# Patient Record
Sex: Female | Born: 1935 | Race: White | Hispanic: No | Marital: Married
Health system: Southern US, Community
[De-identification: ages and names within clinical notes are randomized; demographics above are authoritative.]

---

## 2004-01-20 ENCOUNTER — Other Ambulatory Visit: Payer: Self-pay

## 2004-01-22 ENCOUNTER — Other Ambulatory Visit: Payer: Self-pay

## 2004-01-23 ENCOUNTER — Other Ambulatory Visit: Payer: Self-pay

## 2004-06-20 ENCOUNTER — Encounter: Payer: Self-pay | Admitting: Internal Medicine

## 2004-08-24 ENCOUNTER — Ambulatory Visit: Payer: Self-pay | Admitting: Internal Medicine

## 2004-09-09 ENCOUNTER — Ambulatory Visit: Payer: Self-pay | Admitting: Internal Medicine

## 2004-10-07 ENCOUNTER — Ambulatory Visit: Payer: Self-pay | Admitting: Internal Medicine

## 2004-11-09 ENCOUNTER — Ambulatory Visit: Payer: Self-pay | Admitting: Internal Medicine

## 2004-11-23 ENCOUNTER — Ambulatory Visit: Payer: Self-pay | Admitting: Internal Medicine

## 2004-11-23 ENCOUNTER — Ambulatory Visit: Payer: Self-pay | Admitting: Family Medicine

## 2004-12-21 ENCOUNTER — Ambulatory Visit: Payer: Self-pay | Admitting: Internal Medicine

## 2005-01-07 ENCOUNTER — Ambulatory Visit (HOSPITAL_COMMUNITY): Admission: RE | Admit: 2005-01-07 | Discharge: 2005-01-07 | Payer: Self-pay | Admitting: Neurosurgery

## 2005-01-14 ENCOUNTER — Encounter: Payer: Self-pay | Admitting: Internal Medicine

## 2005-01-18 ENCOUNTER — Ambulatory Visit: Payer: Self-pay | Admitting: Internal Medicine

## 2005-01-26 ENCOUNTER — Ambulatory Visit: Payer: Self-pay | Admitting: Internal Medicine

## 2005-01-28 ENCOUNTER — Ambulatory Visit: Payer: Self-pay | Admitting: General Surgery

## 2005-01-31 ENCOUNTER — Ambulatory Visit: Payer: Self-pay | Admitting: Internal Medicine

## 2005-02-11 ENCOUNTER — Ambulatory Visit: Payer: Self-pay | Admitting: Internal Medicine

## 2005-03-14 ENCOUNTER — Ambulatory Visit: Payer: Self-pay | Admitting: Internal Medicine

## 2005-03-28 ENCOUNTER — Inpatient Hospital Stay (HOSPITAL_COMMUNITY): Admission: RE | Admit: 2005-03-28 | Discharge: 2005-03-30 | Payer: Self-pay | Admitting: Neurosurgery

## 2005-04-06 ENCOUNTER — Ambulatory Visit: Payer: Self-pay | Admitting: Internal Medicine

## 2005-04-18 ENCOUNTER — Ambulatory Visit: Payer: Self-pay | Admitting: Internal Medicine

## 2005-05-02 ENCOUNTER — Ambulatory Visit: Payer: Self-pay | Admitting: Internal Medicine

## 2005-05-31 ENCOUNTER — Ambulatory Visit: Payer: Self-pay | Admitting: Internal Medicine

## 2005-06-14 ENCOUNTER — Ambulatory Visit: Payer: Self-pay | Admitting: General Surgery

## 2005-06-28 ENCOUNTER — Ambulatory Visit: Payer: Self-pay | Admitting: Internal Medicine

## 2005-07-12 ENCOUNTER — Ambulatory Visit: Payer: Self-pay | Admitting: Internal Medicine

## 2005-08-02 ENCOUNTER — Ambulatory Visit: Payer: Self-pay | Admitting: Internal Medicine

## 2005-08-03 ENCOUNTER — Ambulatory Visit: Payer: Self-pay | Admitting: Ophthalmology

## 2005-08-10 ENCOUNTER — Ambulatory Visit: Payer: Self-pay | Admitting: Ophthalmology

## 2005-08-30 ENCOUNTER — Ambulatory Visit: Payer: Self-pay | Admitting: Internal Medicine

## 2005-09-13 ENCOUNTER — Ambulatory Visit: Payer: Self-pay | Admitting: Internal Medicine

## 2005-09-14 ENCOUNTER — Ambulatory Visit: Payer: Self-pay | Admitting: Internal Medicine

## 2005-09-15 ENCOUNTER — Ambulatory Visit: Payer: Self-pay | Admitting: Internal Medicine

## 2005-09-16 ENCOUNTER — Ambulatory Visit: Payer: Self-pay | Admitting: Internal Medicine

## 2005-09-21 ENCOUNTER — Ambulatory Visit: Payer: Self-pay | Admitting: Internal Medicine

## 2005-09-27 ENCOUNTER — Ambulatory Visit: Payer: Self-pay | Admitting: Internal Medicine

## 2005-10-25 ENCOUNTER — Ambulatory Visit: Payer: Self-pay | Admitting: Internal Medicine

## 2005-11-09 ENCOUNTER — Ambulatory Visit: Payer: Self-pay | Admitting: Internal Medicine

## 2005-11-22 ENCOUNTER — Ambulatory Visit: Payer: Self-pay | Admitting: Internal Medicine

## 2005-12-01 ENCOUNTER — Ambulatory Visit: Payer: Self-pay | Admitting: General Surgery

## 2005-12-07 ENCOUNTER — Ambulatory Visit: Payer: Self-pay | Admitting: Internal Medicine

## 2006-01-05 ENCOUNTER — Ambulatory Visit: Payer: Self-pay | Admitting: Internal Medicine

## 2006-02-01 ENCOUNTER — Ambulatory Visit: Payer: Self-pay | Admitting: Internal Medicine

## 2006-02-16 ENCOUNTER — Ambulatory Visit: Payer: Self-pay | Admitting: Internal Medicine

## 2006-02-22 ENCOUNTER — Ambulatory Visit: Payer: Self-pay | Admitting: Internal Medicine

## 2006-03-16 ENCOUNTER — Ambulatory Visit: Payer: Self-pay | Admitting: Internal Medicine

## 2006-03-23 ENCOUNTER — Ambulatory Visit: Payer: Self-pay | Admitting: Internal Medicine

## 2006-04-06 ENCOUNTER — Ambulatory Visit: Payer: Self-pay | Admitting: Internal Medicine

## 2006-05-09 ENCOUNTER — Ambulatory Visit: Payer: Self-pay | Admitting: Internal Medicine

## 2006-06-08 ENCOUNTER — Ambulatory Visit: Payer: Self-pay | Admitting: General Surgery

## 2006-12-07 ENCOUNTER — Ambulatory Visit: Payer: Self-pay | Admitting: General Surgery

## 2007-02-05 ENCOUNTER — Other Ambulatory Visit: Payer: Self-pay

## 2007-02-05 ENCOUNTER — Emergency Department: Payer: Self-pay | Admitting: Unknown Physician Specialty

## 2007-06-11 ENCOUNTER — Ambulatory Visit: Payer: Self-pay | Admitting: General Surgery

## 2007-08-27 ENCOUNTER — Ambulatory Visit: Payer: Self-pay | Admitting: Family Medicine

## 2007-12-06 ENCOUNTER — Ambulatory Visit: Payer: Self-pay | Admitting: Family Medicine

## 2007-12-07 ENCOUNTER — Ambulatory Visit: Payer: Self-pay | Admitting: General Surgery

## 2008-01-09 ENCOUNTER — Ambulatory Visit: Payer: Self-pay

## 2008-01-10 ENCOUNTER — Ambulatory Visit: Payer: Self-pay | Admitting: Family Medicine

## 2008-01-10 ENCOUNTER — Encounter: Payer: Self-pay | Admitting: Internal Medicine

## 2008-03-04 ENCOUNTER — Ambulatory Visit: Payer: Self-pay | Admitting: Family Medicine

## 2008-03-05 ENCOUNTER — Ambulatory Visit (HOSPITAL_COMMUNITY): Admission: RE | Admit: 2008-03-05 | Discharge: 2008-03-05 | Payer: Self-pay | Admitting: Family Medicine

## 2008-06-03 ENCOUNTER — Other Ambulatory Visit: Payer: Self-pay

## 2008-06-03 ENCOUNTER — Emergency Department: Payer: Self-pay | Admitting: Emergency Medicine

## 2008-09-17 ENCOUNTER — Ambulatory Visit: Payer: Self-pay | Admitting: Family Medicine

## 2008-10-07 ENCOUNTER — Telehealth: Payer: Self-pay | Admitting: Internal Medicine

## 2008-10-14 ENCOUNTER — Ambulatory Visit: Payer: Self-pay | Admitting: Internal Medicine

## 2008-10-14 DIAGNOSIS — I4891 Unspecified atrial fibrillation: Secondary | ICD-10-CM | POA: Insufficient documentation

## 2008-10-14 DIAGNOSIS — I2510 Atherosclerotic heart disease of native coronary artery without angina pectoris: Secondary | ICD-10-CM | POA: Insufficient documentation

## 2008-10-14 DIAGNOSIS — R930 Abnormal findings on diagnostic imaging of skull and head, not elsewhere classified: Secondary | ICD-10-CM | POA: Insufficient documentation

## 2008-10-14 DIAGNOSIS — J4489 Other specified chronic obstructive pulmonary disease: Secondary | ICD-10-CM | POA: Insufficient documentation

## 2008-10-14 DIAGNOSIS — J449 Chronic obstructive pulmonary disease, unspecified: Secondary | ICD-10-CM

## 2008-10-14 DIAGNOSIS — I219 Acute myocardial infarction, unspecified: Secondary | ICD-10-CM | POA: Insufficient documentation

## 2008-10-14 DIAGNOSIS — I10 Essential (primary) hypertension: Secondary | ICD-10-CM | POA: Insufficient documentation

## 2008-11-07 ENCOUNTER — Ambulatory Visit: Payer: Self-pay | Admitting: Internal Medicine

## 2008-11-17 ENCOUNTER — Encounter: Payer: Self-pay | Admitting: Internal Medicine

## 2008-11-18 ENCOUNTER — Encounter: Payer: Self-pay | Admitting: Internal Medicine

## 2008-11-21 ENCOUNTER — Encounter: Payer: Self-pay | Admitting: Internal Medicine

## 2008-11-26 ENCOUNTER — Encounter: Payer: Self-pay | Admitting: Internal Medicine

## 2008-12-03 ENCOUNTER — Ambulatory Visit: Payer: Self-pay | Admitting: Internal Medicine

## 2008-12-08 ENCOUNTER — Ambulatory Visit: Payer: Self-pay | Admitting: Internal Medicine

## 2008-12-09 ENCOUNTER — Inpatient Hospital Stay: Payer: Self-pay | Admitting: Internal Medicine

## 2008-12-11 ENCOUNTER — Telehealth (INDEPENDENT_AMBULATORY_CARE_PROVIDER_SITE_OTHER): Payer: Self-pay | Admitting: *Deleted

## 2008-12-12 ENCOUNTER — Ambulatory Visit: Payer: Self-pay | Admitting: Internal Medicine

## 2008-12-17 ENCOUNTER — Ambulatory Visit: Payer: Self-pay

## 2008-12-18 ENCOUNTER — Ambulatory Visit: Payer: Self-pay | Admitting: Family Medicine

## 2008-12-19 ENCOUNTER — Ambulatory Visit: Payer: Self-pay | Admitting: Family Medicine

## 2008-12-23 ENCOUNTER — Ambulatory Visit: Payer: Self-pay

## 2008-12-25 ENCOUNTER — Inpatient Hospital Stay: Payer: Self-pay | Admitting: Internal Medicine

## 2009-01-02 ENCOUNTER — Encounter: Payer: Self-pay | Admitting: Internal Medicine

## 2009-01-05 ENCOUNTER — Ambulatory Visit: Payer: Self-pay | Admitting: Internal Medicine

## 2009-01-05 ENCOUNTER — Telehealth: Payer: Self-pay | Admitting: Internal Medicine

## 2009-01-05 ENCOUNTER — Encounter: Payer: Self-pay | Admitting: Internal Medicine

## 2009-01-13 ENCOUNTER — Ambulatory Visit: Payer: Self-pay | Admitting: Internal Medicine

## 2009-01-21 ENCOUNTER — Ambulatory Visit: Payer: Self-pay | Admitting: Internal Medicine

## 2009-02-05 ENCOUNTER — Encounter: Payer: Self-pay | Admitting: Internal Medicine

## 2009-05-13 ENCOUNTER — Inpatient Hospital Stay: Payer: Self-pay | Admitting: Internal Medicine

## 2009-06-03 ENCOUNTER — Ambulatory Visit: Payer: Self-pay | Admitting: Family Medicine

## 2009-06-07 ENCOUNTER — Ambulatory Visit: Payer: Self-pay | Admitting: Internal Medicine

## 2009-06-12 ENCOUNTER — Ambulatory Visit: Payer: Self-pay | Admitting: Family Medicine

## 2009-06-19 ENCOUNTER — Other Ambulatory Visit: Payer: Self-pay | Admitting: Family Medicine

## 2009-06-22 ENCOUNTER — Other Ambulatory Visit: Payer: Self-pay | Admitting: Family Medicine

## 2009-06-23 ENCOUNTER — Ambulatory Visit: Payer: Self-pay | Admitting: Internal Medicine

## 2009-06-27 ENCOUNTER — Inpatient Hospital Stay: Payer: Self-pay | Admitting: Internal Medicine

## 2009-07-02 ENCOUNTER — Emergency Department: Payer: Self-pay | Admitting: Emergency Medicine

## 2009-07-07 ENCOUNTER — Ambulatory Visit: Payer: Self-pay | Admitting: Urology

## 2009-07-08 ENCOUNTER — Ambulatory Visit: Payer: Self-pay | Admitting: Internal Medicine

## 2009-08-07 ENCOUNTER — Ambulatory Visit: Payer: Self-pay | Admitting: Internal Medicine

## 2009-09-07 ENCOUNTER — Ambulatory Visit: Payer: Self-pay | Admitting: Internal Medicine

## 2009-10-10 ENCOUNTER — Ambulatory Visit: Payer: Self-pay | Admitting: Family Medicine

## 2009-10-23 ENCOUNTER — Ambulatory Visit: Payer: Self-pay | Admitting: Family Medicine

## 2009-10-28 ENCOUNTER — Ambulatory Visit: Payer: Self-pay | Admitting: Family Medicine

## 2009-11-18 ENCOUNTER — Ambulatory Visit: Payer: Self-pay | Admitting: Family Medicine

## 2009-11-20 ENCOUNTER — Ambulatory Visit: Payer: Self-pay | Admitting: Family Medicine

## 2009-11-23 ENCOUNTER — Ambulatory Visit: Payer: Self-pay | Admitting: Family Medicine

## 2009-11-24 ENCOUNTER — Observation Stay: Payer: Self-pay | Admitting: Internal Medicine

## 2009-11-30 ENCOUNTER — Ambulatory Visit: Payer: Self-pay | Admitting: Family Medicine

## 2009-12-04 ENCOUNTER — Inpatient Hospital Stay: Payer: Self-pay | Admitting: Orthopedic Surgery

## 2009-12-08 ENCOUNTER — Encounter: Payer: Self-pay | Admitting: Internal Medicine

## 2010-01-05 ENCOUNTER — Encounter: Payer: Self-pay | Admitting: Internal Medicine

## 2010-03-16 ENCOUNTER — Ambulatory Visit: Payer: Self-pay | Admitting: Urology

## 2010-05-26 IMAGING — CR DG CHEST 1V PORT
1 series · 1 of 1 positions shown · non-contrast
Comparison: none

REASON FOR EXAM: dyspnea  --  ed [HOSPITAL]
COMMENTS:

[view not recorded]
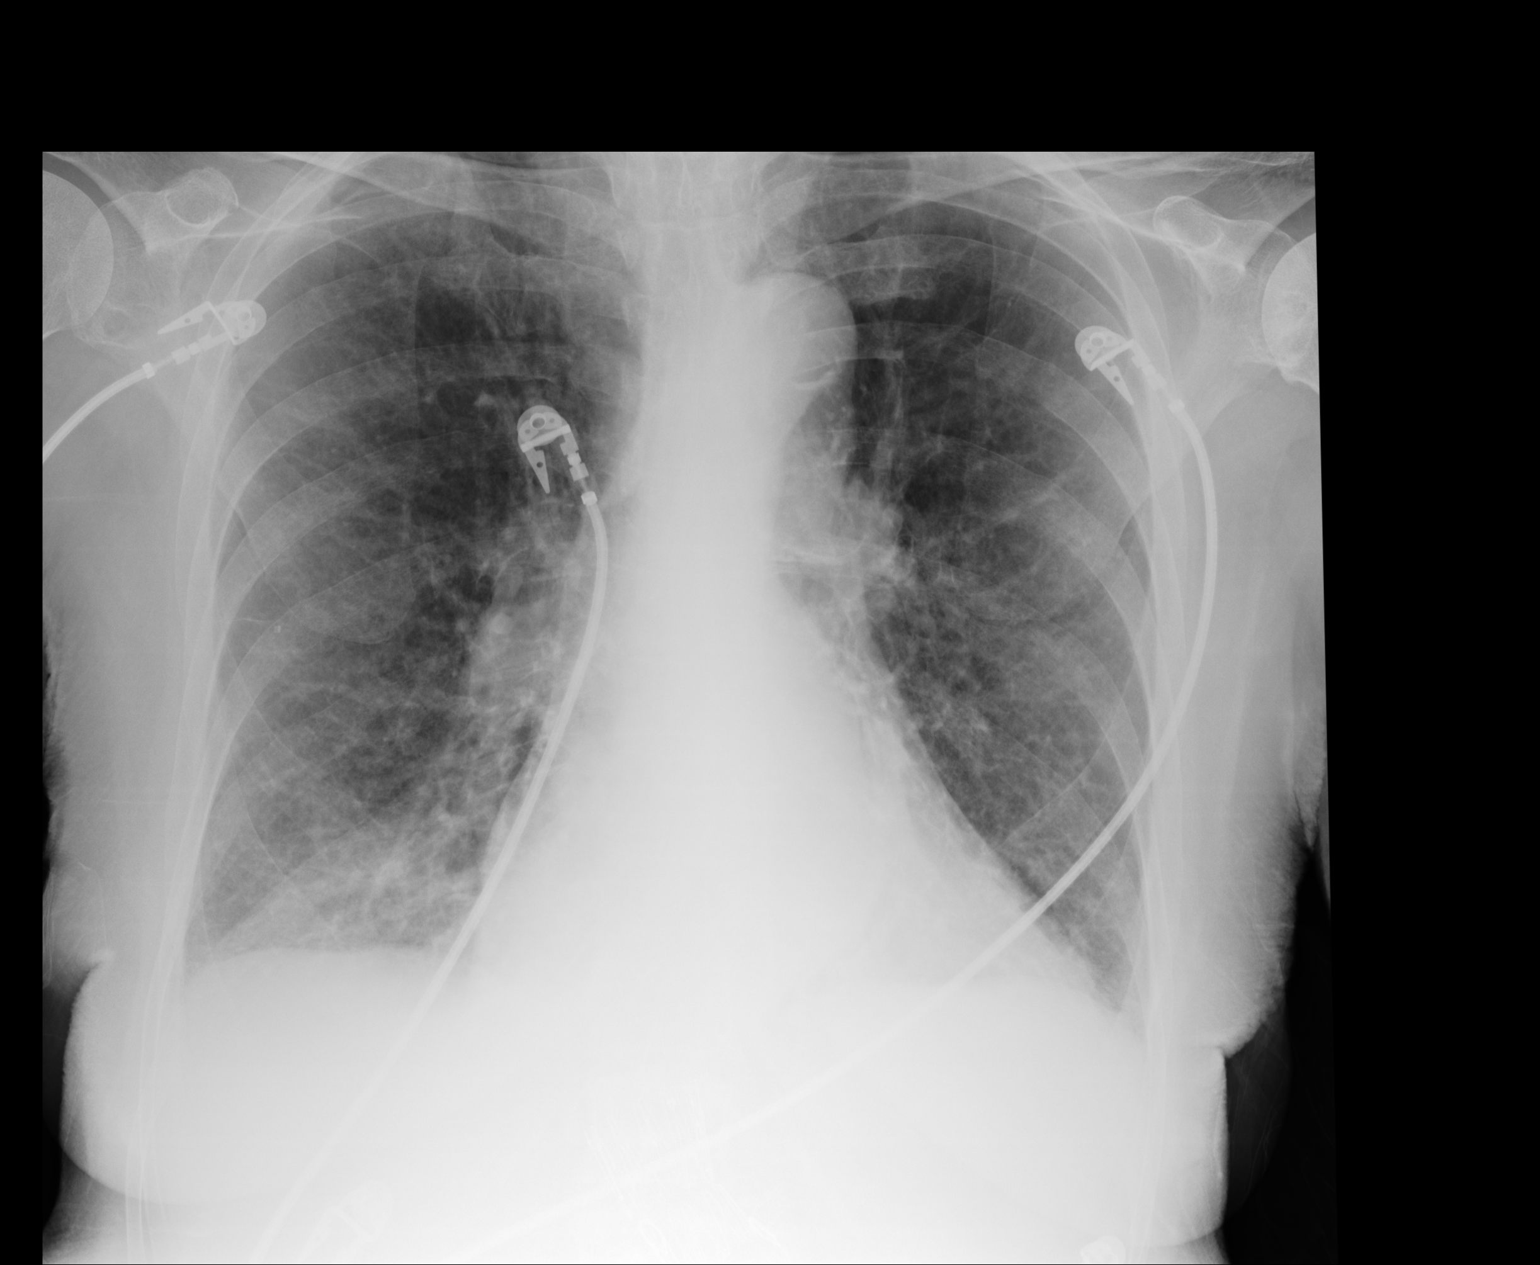

[1 of 1 positions shown; findings below may reference images not displayed]

PROCEDURE:     DXR - DXR PORTABLE CHEST SINGLE VIEW  - December 09, 2008  [DATE]

RESULT:     Comparison is made to prior study dated 09/17/08.

There is flattening of the hemidiaphragms. Thickening of the interstitial
marking is appreciated. No focal regions of consolidation or focal
infiltrate are appreciated. The cardiac silhouette is moderately enlarged.
The visualized bony skeleton is osteopenic.
IMPRESSION: 1. COPD with superimposed pulmonary fibrosis as well as possibly pulmonary
vascular congestion. No focal regions of consolidation.

## 2010-12-22 IMAGING — US US RENAL KIDNEY
1 series · 17 of 25 positions shown · non-contrast
Comparison: none

REASON FOR EXAM: nephrolithiasis
COMMENTS:

[Series 1: us renal kidney · 17 of 42 slices shown]
[im 1/42]
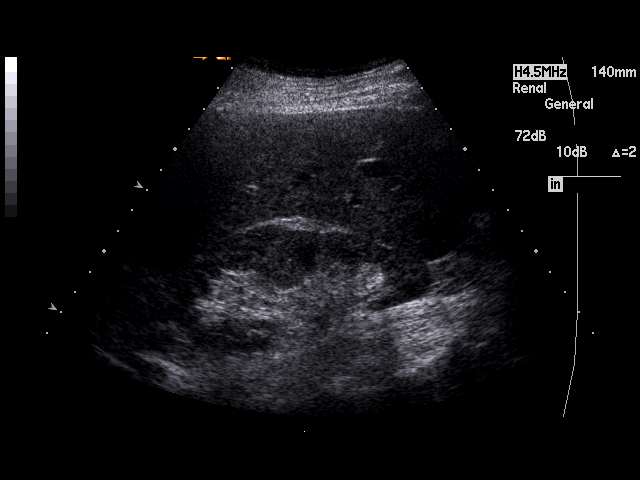
[im 4/42]
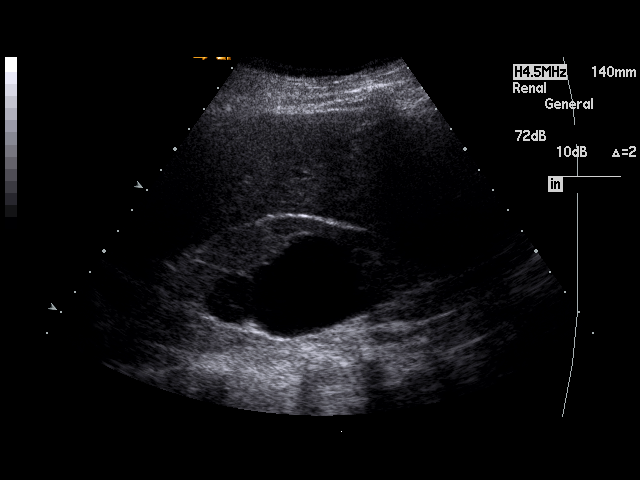
[im 6/42]
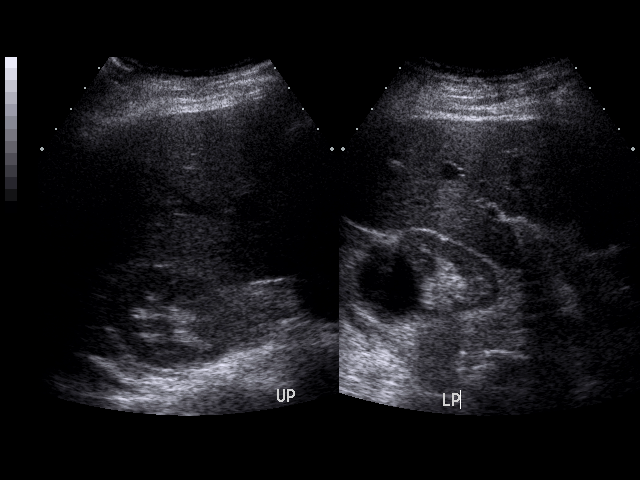
[im 9/42]
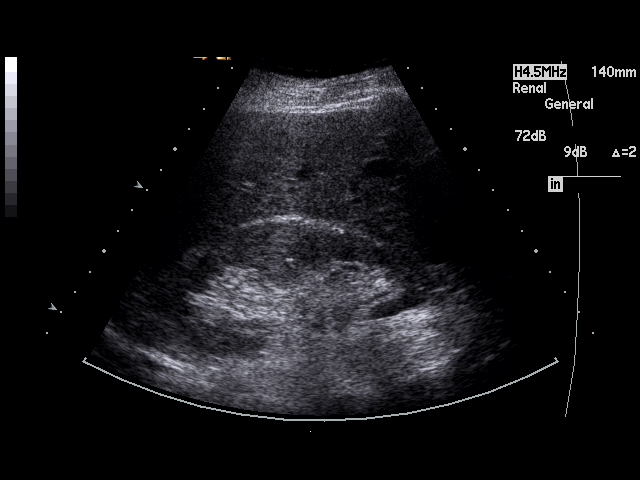
[im 11/42]
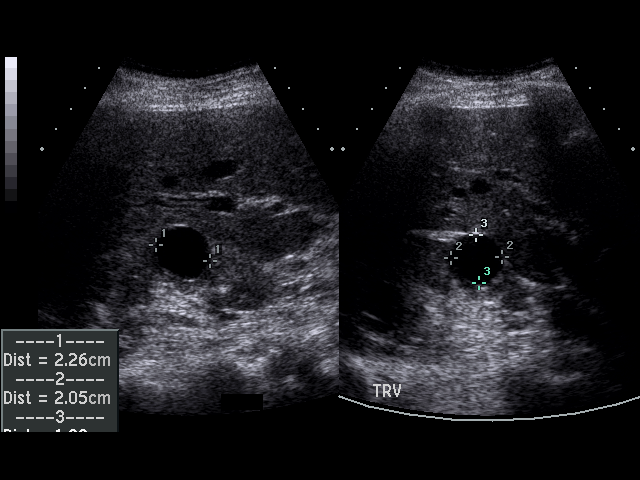
[im 14/42]
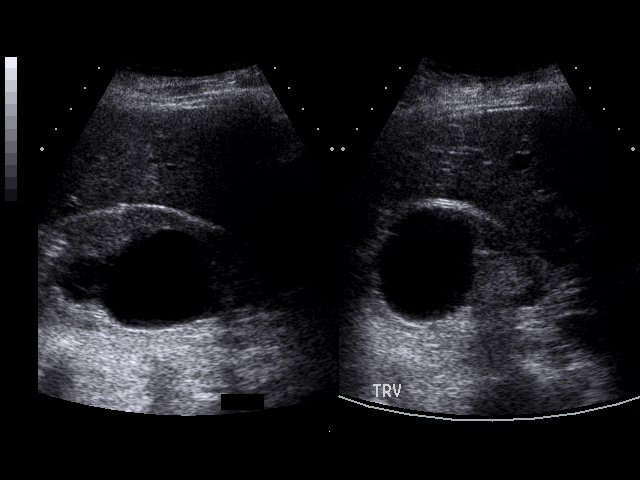
[im 16/42]
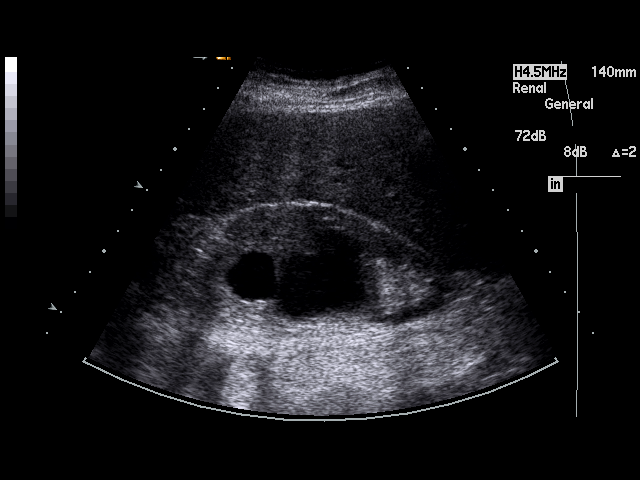
[im 19/42]
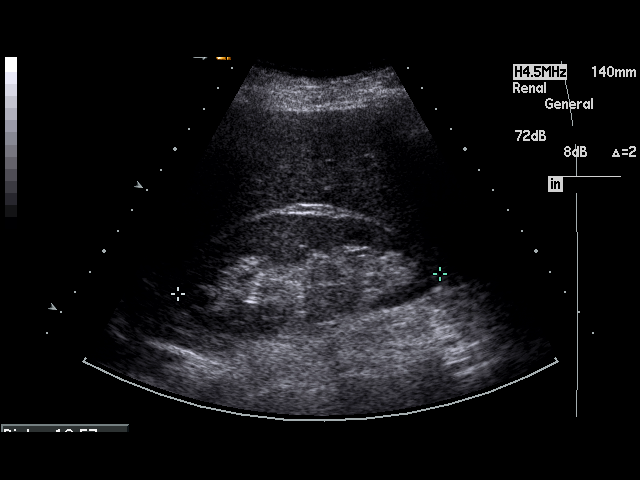
[im 21/42]
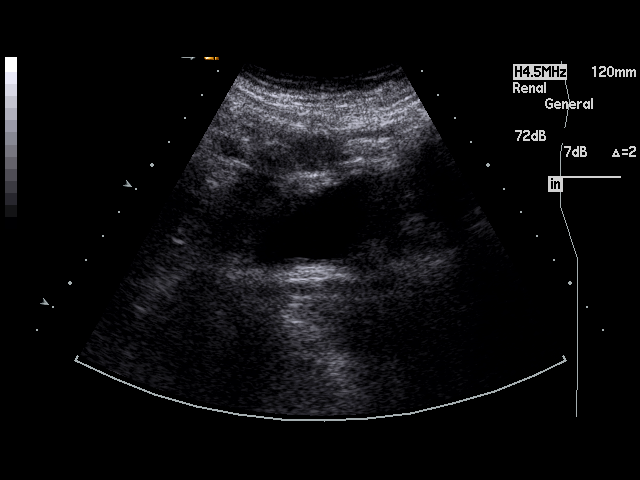
[im 23/42]
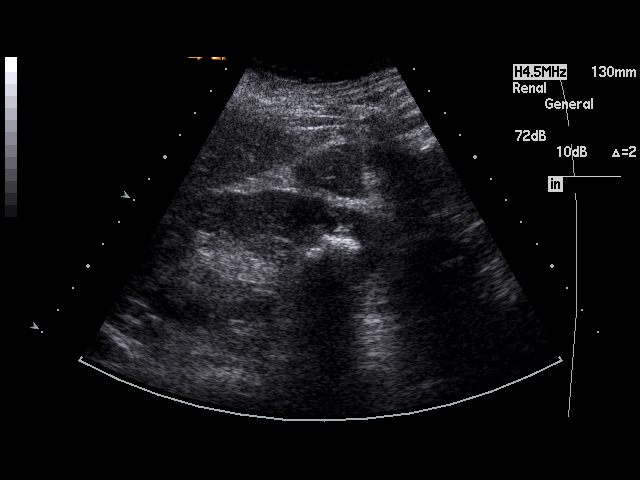
[im 26/42]
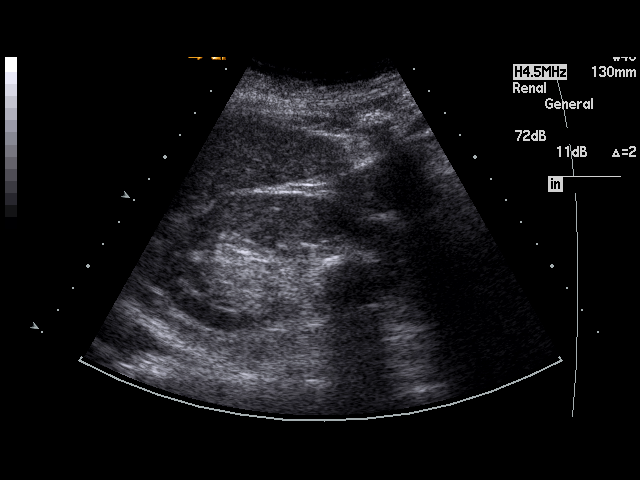
[im 28/42]
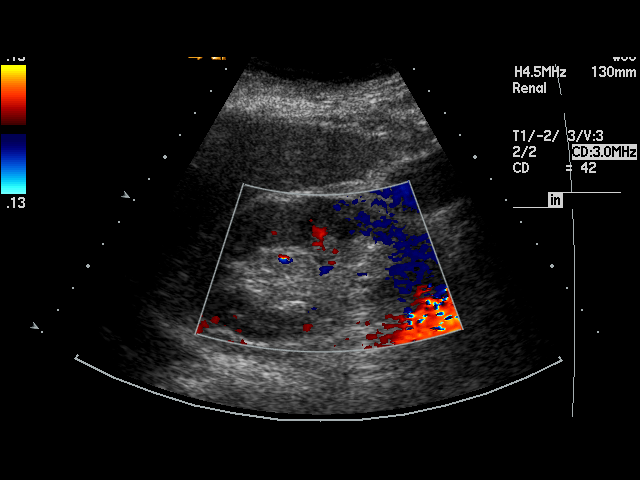
[im 31/42]
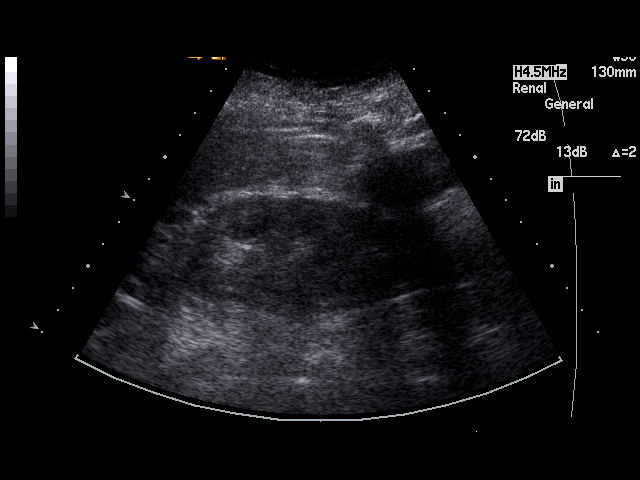
[im 33/42]
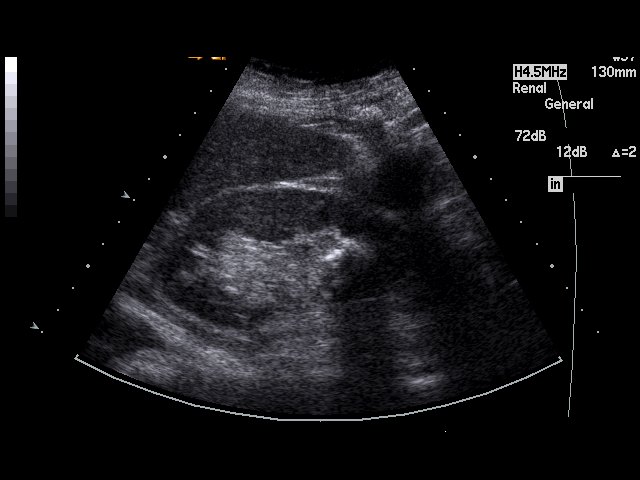
[im 36/42]
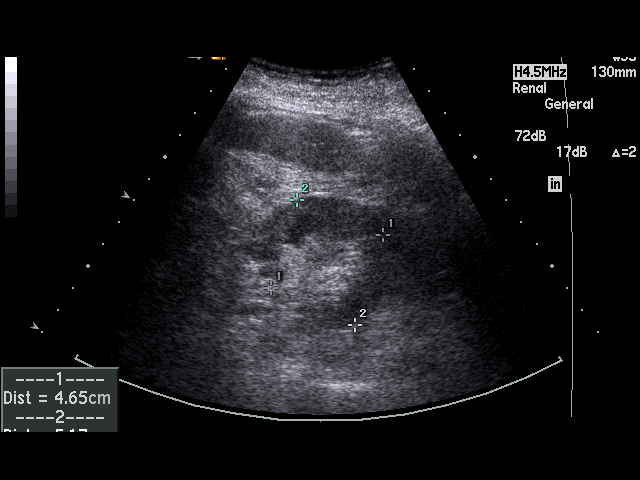
[im 38/42]
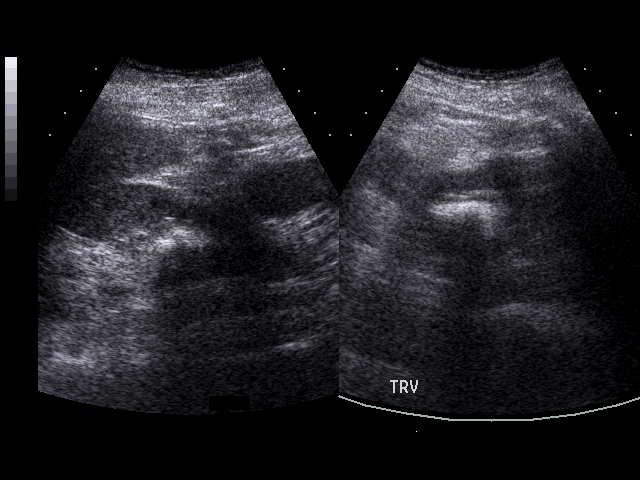
[im 42/42]
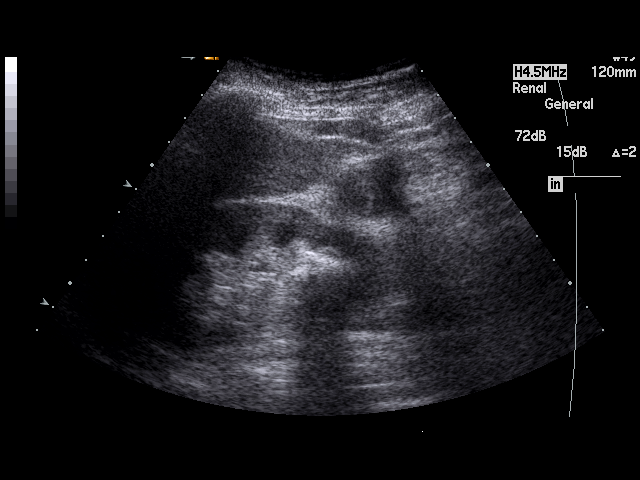

[17 of 25 positions shown; findings below may reference images not displayed]

PROCEDURE:     US  - US KIDNEY  - July 07, 2009  [DATE]

RESULT:     The right kidney measures 10.52 cm x 4.96 cm x 4.8 cm and the
left kidney measures 10.15 cm x 4.65 cm x 5.17 cm. There are observed three
cysts of the right kidney. The largest is in the midpole region and measures
4.45 cm at maximum diameter. Two additional cysts are seen on the right with
the larger measuring 2.5 cm at maximum diameter and a smaller measuring
cm at maximum diameter. No solid or cystic renal masses are seen on the
left. No hydronephrosis is seen. There are echodensities at the lower pole
of the left kidney consistent with renal calcifications. The wall of the
urinary bladder appears slightly thickened but this may be due to the nearly
empty status of the bladder. No ascites is observed.
IMPRESSION: 1. There are multiple right renal cysts.
2. Left nephrolithiasis.
3. There is mild nonspecific thickening of the bladder wall.
4. No hydronephrosis is seen.

## 2011-03-25 NOTE — Op Note (Signed)
Morgan Levine, Morgan Levine              ACCOUNT NO.:  [id number]   MEDICAL RECORD NO.:  [id number]          PATIENT TYPE:  INP   LOCATION:  2899                         FACILITY:  [facility]   PHYSICIAN:  Clydene Fake, M.D.  DATE OF BIRTH:  October 03, 1933   DATE OF PROCEDURE:  03/28/2005  DATE OF DISCHARGE:                                 OPERATIVE REPORT   DIAGNOSES:  1.  Lumbar stenosis, spondylosis L3-4 and 5-1 with prior surgery.  2.  Disk herniation, right sided, at 5-1.   POSTOPERATIVE DIAGNOSES:  1.  Lumbar stenosis, spondylosis L3-4 and 5-1 with prior surgery.  2.  Disk herniation right side at 5-1.   PROCEDURE:  1.  Redo decompressive laminectomy L5 S1 with right sided diskectomy, and      right L3-4 decompressive laminectomy.  2.  Cavo microdissection with microscope.   SURGEON:  Colon Branch, M.D.   ASSISTANT:  Maeola Harman, M.D.   ANESTHESIA:  General endotracheal tube.   ESTIMATED BLOOD LOSS:  Minimal.   BLOOD GIVEN:  None.   DRAINS:  None.   COMPLICATIONS:  None.   REASON FOR PROCEDURE:  The patient is a 77 year old woman who has had back  and right leg pain and numbness that radiates down into her foot and MRI was  done showing lumbar stenosis spondylitic changes 3-4, 4-5, 5-1 but more  stenosis at 3-4 and 5-1 with large disk herniation right side at 5-1.  Patient brought in for decompressive laminectomy.  She has some cardiac  history, been on Coumadin, she had been off that for days and the labs preop  were good and she has been through cardiac and vascular surgery clearance  for the medical problems.   PROCEDURE IN DETAIL:  The patient was brought to the operating room, general  anesthesia induced.  The patient was placed in the prone position on a  Wilson frame with all pressure points padded.  The patient was prepped and  draped in a sterile fashion in the supine position, was injected with 20 mL  of 1% lidocaine with epinephrine.  Incision has been made at  the site of  previous scar in the lumbar spine incision and this was extended both  cephalad and caudally and incision taken down to the fascia.  Hemostasis was  obtained with Bovie cauterization.  The fascia was incised with the Bovie  and markers were placed at the two interspaces.  X-rays were obtained  assuring this was the 2-3 and 4-5 disk spaces.  We then dissected the 3-4  and 5-1 spaces, doing a subperiosteal dissection of the spinous process of  lamina up the facet, so retractors were placed at both levels.  Markers were  placed at the interspaces.  Another x-ray obtained confirmed their  positioning at 3-4 and 5-1 spaces.  A high speed drill was then used to  start a semihemilaminectomy.  After a scope was brought in for  microdissection, this was at the 3-4 level, and Kerrison punches were used  to continue the decompressive laminectomy and medial facetectomy.  The  ligamentum flavum was removed.  The top of the fourth lamina was done over  the four roots.  We used upward angled curets to remove hypertrophic  ligaments contralaterally, esophacardially and laterally making sure we  had  good decompression of this level.  We explored the epidural space and disk  space, there was a disk bulge but no herniation, disk was left intact.  Hemostasis was obtained with Gelfoam and  thrombin.  Attention was then  taken to the 5-1 level.  There was some hemilaminectomy that was made with  an instrument drill and then Kerrison punches.  There was some scar on this  area, consistent with prior surgery.  We carefully dissected through the  scar, removed very hypertrophic ligaments and dissected down to the disk  space where free fragment disk was found, this was removed, this  decompressed the S1 root.  We then entered the disk space and performed  diskectomy with pituitary rongeurs and curets.  When we were finished, we  had good central and lateral decompression of the central canal at 5-1  along  with decompression of the S1 and L5 roots.  __________ irrigation was used.  Hemostasis obtained with bipolar cauterization and Gelfoam and thrombin.  Gelfoam was irrigated out and we removed all retractors and irrigated with  antibiotic solution.  The fascia was closed with 0 Vicryl interrupted type  suture, the subcutaneous tissue closed with 2-0 and 3-0 Vicryl interrupted  suture and the skin closed with Benzoin and Steri-Strips.  A dressing was  placed, the patient was placed back into a supine, awoken from anesthesia  and transferred to the recovery room in stable condition.      JRH/MEDQ  D:  03/28/2005  T:  03/28/2005  Job:  [id number]

## 2011-04-22 ENCOUNTER — Emergency Department: Payer: Self-pay | Admitting: Emergency Medicine

## 2011-04-23 ENCOUNTER — Inpatient Hospital Stay: Payer: Self-pay | Admitting: Internal Medicine

## 2011-05-31 ENCOUNTER — Ambulatory Visit: Payer: Self-pay | Admitting: Urology

## 2011-08-02 LAB — CROSSMATCH

## 2011-12-15 ENCOUNTER — Ambulatory Visit: Payer: Self-pay | Admitting: Family Medicine

## 2013-01-01 ENCOUNTER — Ambulatory Visit: Payer: Self-pay | Admitting: Family Medicine

## 2013-01-30 ENCOUNTER — Ambulatory Visit: Payer: Self-pay | Admitting: Family Medicine

## 2014-01-03 ENCOUNTER — Emergency Department: Payer: Self-pay | Admitting: Emergency Medicine

## 2014-01-03 LAB — CBC
HCT: 35 % (ref 35.0–47.0)
HGB: 10.8 g/dL — AB (ref 12.0–16.0)
MCH: 26.7 pg (ref 26.0–34.0)
MCHC: 30.8 g/dL — ABNORMAL LOW (ref 32.0–36.0)
MCV: 87 fL (ref 80–100)
Platelet: 152 10*3/uL (ref 150–440)
RBC: 4.03 10*6/uL (ref 3.80–5.20)
RDW: 17.6 % — ABNORMAL HIGH (ref 11.5–14.5)
WBC: 8.8 10*3/uL (ref 3.6–11.0)

## 2014-01-03 LAB — COMPREHENSIVE METABOLIC PANEL
ALT: 12 U/L (ref 12–78)
ANION GAP: 0 — AB (ref 7–16)
Albumin: 2.6 g/dL — ABNORMAL LOW (ref 3.4–5.0)
Alkaline Phosphatase: 78 U/L
BUN: 19 mg/dL — ABNORMAL HIGH (ref 7–18)
Bilirubin,Total: 0.5 mg/dL (ref 0.2–1.0)
CALCIUM: 9.8 mg/dL (ref 8.5–10.1)
CO2: 34 mmol/L — AB (ref 21–32)
CREATININE: 1.57 mg/dL — AB (ref 0.60–1.30)
Chloride: 106 mmol/L (ref 98–107)
EGFR (Non-African Amer.): 31 — ABNORMAL LOW
GFR CALC AF AMER: 36 — AB
Glucose: 77 mg/dL (ref 65–99)
OSMOLALITY: 280 (ref 275–301)
POTASSIUM: 4.2 mmol/L (ref 3.5–5.1)
SGOT(AST): 16 U/L (ref 15–37)
SODIUM: 140 mmol/L (ref 136–145)
TOTAL PROTEIN: 7.1 g/dL (ref 6.4–8.2)

## 2014-01-03 LAB — PROTIME-INR
INR: 3.5
Prothrombin Time: 34.3 secs — ABNORMAL HIGH (ref 11.5–14.7)

## 2014-01-03 LAB — TROPONIN I: Troponin-I: <0.02 ng/mL

## 2014-02-28 ENCOUNTER — Inpatient Hospital Stay: Payer: Self-pay | Admitting: Internal Medicine

## 2014-02-28 LAB — PROTIME-INR
INR: 3.9
PROTHROMBIN TIME: 36.8 s — AB (ref 11.5–14.7)

## 2014-02-28 LAB — COMPREHENSIVE METABOLIC PANEL
ALK PHOS: 81 U/L
ALT: 10 U/L — AB (ref 12–78)
ANION GAP: 3 — AB (ref 7–16)
AST: 10 U/L — AB (ref 15–37)
Albumin: 2.9 g/dL — ABNORMAL LOW (ref 3.4–5.0)
BUN: 30 mg/dL — AB (ref 7–18)
Bilirubin,Total: 0.6 mg/dL (ref 0.2–1.0)
CHLORIDE: 98 mmol/L (ref 98–107)
CO2: 33 mmol/L — AB (ref 21–32)
Calcium, Total: 10.8 mg/dL — ABNORMAL HIGH (ref 8.5–10.1)
Creatinine: 2.01 mg/dL — ABNORMAL HIGH (ref 0.60–1.30)
GFR CALC AF AMER: 27 — AB
GFR CALC NON AF AMER: 23 — AB
Glucose: 88 mg/dL (ref 65–99)
Osmolality: 274 (ref 275–301)
POTASSIUM: 5.6 mmol/L — AB (ref 3.5–5.1)
SODIUM: 134 mmol/L — AB (ref 136–145)
TOTAL PROTEIN: 7.5 g/dL (ref 6.4–8.2)

## 2014-02-28 LAB — CBC
HCT: 41.1 % (ref 35.0–47.0)
HGB: 12.1 g/dL (ref 12.0–16.0)
MCH: 26.5 pg (ref 26.0–34.0)
MCHC: 29.5 g/dL — ABNORMAL LOW (ref 32.0–36.0)
MCV: 90 fL (ref 80–100)
PLATELETS: 231 10*3/uL (ref 150–440)
RBC: 4.58 10*6/uL (ref 3.80–5.20)
RDW: 18.4 % — ABNORMAL HIGH (ref 11.5–14.5)
WBC: 9.5 10*3/uL (ref 3.6–11.0)

## 2014-02-28 LAB — TROPONIN I

## 2014-02-28 LAB — PRO B NATRIURETIC PEPTIDE: B-Type Natriuretic Peptide: 28472 pg/mL — ABNORMAL HIGH (ref 0–450)

## 2014-02-28 LAB — CK TOTAL AND CKMB (NOT AT ARMC)
CK, TOTAL: 13 U/L — AB
CK-MB: 1.2 ng/mL (ref 0.5–3.6)

## 2014-03-01 LAB — CBC WITH DIFFERENTIAL/PLATELET
Basophil #: 0 10*3/uL (ref 0.0–0.1)
Basophil %: 0.1 %
EOS PCT: 0 %
Eosinophil #: 0 10*3/uL (ref 0.0–0.7)
HCT: 36.2 % (ref 35.0–47.0)
HGB: 11.1 g/dL — ABNORMAL LOW (ref 12.0–16.0)
LYMPHS ABS: 0.2 10*3/uL — AB (ref 1.0–3.6)
Lymphocyte %: 5.6 %
MCH: 27.3 pg (ref 26.0–34.0)
MCHC: 30.8 g/dL — ABNORMAL LOW (ref 32.0–36.0)
MCV: 89 fL (ref 80–100)
MONOS PCT: 1.2 %
Monocyte #: 0 x10 3/mm — ABNORMAL LOW (ref 0.2–0.9)
Neutrophil #: 3.2 10*3/uL (ref 1.4–6.5)
Neutrophil %: 93.1 %
PLATELETS: 179 10*3/uL (ref 150–440)
RBC: 4.07 10*6/uL (ref 3.80–5.20)
RDW: 18.2 % — AB (ref 11.5–14.5)
WBC: 3.5 10*3/uL — ABNORMAL LOW (ref 3.6–11.0)

## 2014-03-01 LAB — BASIC METABOLIC PANEL
Anion Gap: 8 (ref 7–16)
BUN: 36 mg/dL — ABNORMAL HIGH (ref 7–18)
CO2: 26 mmol/L (ref 21–32)
CREATININE: 2.4 mg/dL — AB (ref 0.60–1.30)
Calcium, Total: 10.4 mg/dL — ABNORMAL HIGH (ref 8.5–10.1)
Chloride: 101 mmol/L (ref 98–107)
EGFR (Non-African Amer.): 19 — ABNORMAL LOW
GFR CALC AF AMER: 22 — AB
GLUCOSE: 64 mg/dL — AB (ref 65–99)
OSMOLALITY: 277 (ref 275–301)
Potassium: 6.1 mmol/L — ABNORMAL HIGH (ref 3.5–5.1)
SODIUM: 135 mmol/L — AB (ref 136–145)

## 2014-03-01 LAB — TSH: Thyroid Stimulating Horm: 1.32 u[IU]/mL

## 2014-03-02 LAB — CBC WITH DIFFERENTIAL/PLATELET
BASOS PCT: 0.1 %
Basophil #: 0 10*3/uL (ref 0.0–0.1)
Eosinophil #: 0 10*3/uL (ref 0.0–0.7)
Eosinophil %: 0 %
HCT: 32.2 % — ABNORMAL LOW (ref 35.0–47.0)
HGB: 10 g/dL — AB (ref 12.0–16.0)
LYMPHS PCT: 5.1 %
Lymphocyte #: 0.1 10*3/uL — ABNORMAL LOW (ref 1.0–3.6)
MCH: 27.1 pg (ref 26.0–34.0)
MCHC: 30.9 g/dL — AB (ref 32.0–36.0)
MCV: 88 fL (ref 80–100)
MONO ABS: 0.2 x10 3/mm (ref 0.2–0.9)
Monocyte %: 7.3 %
Neutrophil #: 2.5 10*3/uL (ref 1.4–6.5)
Neutrophil %: 87.5 %
Platelet: 159 10*3/uL (ref 150–440)
RBC: 3.68 10*6/uL — ABNORMAL LOW (ref 3.80–5.20)
RDW: 18.4 % — AB (ref 11.5–14.5)
WBC: 2.9 10*3/uL — ABNORMAL LOW (ref 3.6–11.0)

## 2014-03-02 LAB — BASIC METABOLIC PANEL
ANION GAP: 6 — AB (ref 7–16)
BUN: 44 mg/dL — ABNORMAL HIGH (ref 7–18)
CO2: 30 mmol/L (ref 21–32)
CREATININE: 2.56 mg/dL — AB (ref 0.60–1.30)
Calcium, Total: 9.9 mg/dL (ref 8.5–10.1)
Chloride: 102 mmol/L (ref 98–107)
EGFR (Non-African Amer.): 17 — ABNORMAL LOW
GFR CALC AF AMER: 20 — AB
GLUCOSE: 103 mg/dL — AB (ref 65–99)
OSMOLALITY: 287 (ref 275–301)
Potassium: 4.3 mmol/L (ref 3.5–5.1)
Sodium: 138 mmol/L (ref 136–145)

## 2014-03-02 LAB — PROTIME-INR
INR: 6.4
PROTHROMBIN TIME: 53.9 s — AB (ref 11.5–14.7)

## 2014-03-03 LAB — BASIC METABOLIC PANEL
ANION GAP: 5 — AB (ref 7–16)
BUN: 42 mg/dL — ABNORMAL HIGH (ref 7–18)
CO2: 31 mmol/L (ref 21–32)
Calcium, Total: 9.7 mg/dL (ref 8.5–10.1)
Chloride: 104 mmol/L (ref 98–107)
Creatinine: 2.18 mg/dL — ABNORMAL HIGH (ref 0.60–1.30)
GFR CALC AF AMER: 24 — AB
GFR CALC NON AF AMER: 21 — AB
Glucose: 148 mg/dL — ABNORMAL HIGH (ref 65–99)
OSMOLALITY: 293 (ref 275–301)
Potassium: 3.8 mmol/L (ref 3.5–5.1)
Sodium: 140 mmol/L (ref 136–145)

## 2014-03-03 LAB — PROTIME-INR
INR: 3.7
PROTHROMBIN TIME: 35.3 s — AB (ref 11.5–14.7)

## 2014-03-05 LAB — CULTURE, BLOOD (SINGLE)

## 2014-10-07 ENCOUNTER — Ambulatory Visit: Payer: Self-pay | Admitting: Internal Medicine

## 2014-10-18 ENCOUNTER — Inpatient Hospital Stay: Payer: Self-pay | Admitting: Internal Medicine

## 2014-10-18 LAB — COMPREHENSIVE METABOLIC PANEL
ALK PHOS: 114 U/L
ALT: 8 U/L — AB
AST: 16 U/L (ref 15–37)
Albumin: 3 g/dL — ABNORMAL LOW (ref 3.4–5.0)
Anion Gap: 4 — ABNORMAL LOW (ref 7–16)
BUN: 22 mg/dL — ABNORMAL HIGH (ref 7–18)
Bilirubin,Total: 0.5 mg/dL (ref 0.2–1.0)
CALCIUM: 11.2 mg/dL — AB (ref 8.5–10.1)
CO2: 32 mmol/L (ref 21–32)
CREATININE: 2.18 mg/dL — AB (ref 0.60–1.30)
Chloride: 103 mmol/L (ref 98–107)
GFR CALC AF AMER: 28 — AB
GFR CALC NON AF AMER: 23 — AB
GLUCOSE: 107 mg/dL — AB (ref 65–99)
OSMOLALITY: 281 (ref 275–301)
POTASSIUM: 4.8 mmol/L (ref 3.5–5.1)
SODIUM: 139 mmol/L (ref 136–145)
Total Protein: 8.1 g/dL (ref 6.4–8.2)

## 2014-10-18 LAB — CBC WITH DIFFERENTIAL/PLATELET
Basophil #: 0.1 10*3/uL (ref 0.0–0.1)
Basophil %: 0.5 %
EOS ABS: 0.3 10*3/uL (ref 0.0–0.7)
EOS PCT: 2.8 %
HCT: 39 % (ref 35.0–47.0)
HGB: 11.8 g/dL — ABNORMAL LOW (ref 12.0–16.0)
LYMPHS ABS: 1.1 10*3/uL (ref 1.0–3.6)
Lymphocyte %: 11.1 %
MCH: 27.5 pg (ref 26.0–34.0)
MCHC: 30.2 g/dL — AB (ref 32.0–36.0)
MCV: 91 fL (ref 80–100)
MONOS PCT: 10.5 %
Monocyte #: 1 x10 3/mm — ABNORMAL HIGH (ref 0.2–0.9)
NEUTROS ABS: 7.3 10*3/uL — AB (ref 1.4–6.5)
Neutrophil %: 75.1 %
PLATELETS: 173 10*3/uL (ref 150–440)
RBC: 4.28 10*6/uL (ref 3.80–5.20)
RDW: 17.5 % — AB (ref 11.5–14.5)
WBC: 9.7 10*3/uL (ref 3.6–11.0)

## 2014-10-18 LAB — CK TOTAL AND CKMB (NOT AT ARMC)
CK, TOTAL: 20 U/L — AB (ref 26–192)
CK, Total: 12 U/L — ABNORMAL LOW (ref 26–192)
CK, Total: 14 U/L — ABNORMAL LOW (ref 26–192)
CK-MB: <0.5 ng/mL — ABNORMAL LOW (ref 0.5–3.6)
CK-MB: <0.5 ng/mL — ABNORMAL LOW (ref 0.5–3.6)
CK-MB: 0.6 ng/mL (ref 0.5–3.6)

## 2014-10-18 LAB — URINALYSIS, COMPLETE
BILIRUBIN, UR: NEGATIVE
Glucose,UR: NEGATIVE mg/dL (ref 0–75)
KETONE: NEGATIVE
Nitrite: POSITIVE
PH: 5 (ref 4.5–8.0)
RBC,UR: 395 /HPF (ref 0–5)
SPECIFIC GRAVITY: 1.008 (ref 1.003–1.030)

## 2014-10-18 LAB — PROTIME-INR
INR: 2.1
PROTHROMBIN TIME: 23 s — AB (ref 11.5–14.7)

## 2014-10-18 LAB — TROPONIN I
Troponin-I: <0.02 ng/mL
Troponin-I: <0.02 ng/mL

## 2014-10-18 LAB — LIPASE, BLOOD: LIPASE: 48 U/L — AB (ref 73–393)

## 2014-10-18 LAB — PRO B NATRIURETIC PEPTIDE: B-TYPE NATIURETIC PEPTID: 13972 pg/mL — AB (ref 0–450)

## 2014-10-18 LAB — MAGNESIUM: Magnesium: 1.9 mg/dL

## 2014-10-19 LAB — CBC WITH DIFFERENTIAL/PLATELET
BASOS PCT: 0.1 %
Basophil #: 0 10*3/uL (ref 0.0–0.1)
Eosinophil #: 0 10*3/uL (ref 0.0–0.7)
Eosinophil %: 0.1 %
HCT: 32.1 % — AB (ref 35.0–47.0)
HGB: 10 g/dL — ABNORMAL LOW (ref 12.0–16.0)
LYMPHS PCT: 6 %
Lymphocyte #: 0.2 10*3/uL — ABNORMAL LOW (ref 1.0–3.6)
MCH: 27.9 pg (ref 26.0–34.0)
MCHC: 31.1 g/dL — AB (ref 32.0–36.0)
MCV: 90 fL (ref 80–100)
Monocyte #: 0.1 x10 3/mm — ABNORMAL LOW (ref 0.2–0.9)
Monocyte %: 3.6 %
Neutrophil #: 3 10*3/uL (ref 1.4–6.5)
Neutrophil %: 90.2 %
Platelet: 141 10*3/uL — ABNORMAL LOW (ref 150–440)
RBC: 3.59 10*6/uL — AB (ref 3.80–5.20)
RDW: 16.5 % — ABNORMAL HIGH (ref 11.5–14.5)
WBC: 3.3 10*3/uL — AB (ref 3.6–11.0)

## 2014-10-19 LAB — COMPREHENSIVE METABOLIC PANEL
ALBUMIN: 2.6 g/dL — AB (ref 3.4–5.0)
ALK PHOS: 89 U/L
AST: 12 U/L — AB (ref 15–37)
Anion Gap: 5 — ABNORMAL LOW (ref 7–16)
BILIRUBIN TOTAL: 0.3 mg/dL (ref 0.2–1.0)
BUN: 29 mg/dL — AB (ref 7–18)
CALCIUM: 10.4 mg/dL — AB (ref 8.5–10.1)
CO2: 33 mmol/L — AB (ref 21–32)
Chloride: 101 mmol/L (ref 98–107)
Creatinine: 2.36 mg/dL — ABNORMAL HIGH (ref 0.60–1.30)
GFR CALC AF AMER: 26 — AB
GFR CALC NON AF AMER: 21 — AB
Glucose: 154 mg/dL — ABNORMAL HIGH (ref 65–99)
OSMOLALITY: 286 (ref 275–301)
POTASSIUM: 4.6 mmol/L (ref 3.5–5.1)
SGPT (ALT): 7 U/L — ABNORMAL LOW
SODIUM: 139 mmol/L (ref 136–145)
Total Protein: 7 g/dL (ref 6.4–8.2)

## 2014-10-19 LAB — PROTIME-INR
INR: 2.8
PROTHROMBIN TIME: 29.1 s — AB (ref 11.5–14.7)

## 2014-10-20 LAB — CBC WITH DIFFERENTIAL/PLATELET
BASOS ABS: 0 10*3/uL (ref 0.0–0.1)
Basophil %: 0.1 %
Eosinophil #: 0 10*3/uL (ref 0.0–0.7)
Eosinophil %: 0 %
HCT: 33.4 % — ABNORMAL LOW (ref 35.0–47.0)
HGB: 10.5 g/dL — ABNORMAL LOW (ref 12.0–16.0)
LYMPHS PCT: 3.8 %
Lymphocyte #: 0.2 10*3/uL — ABNORMAL LOW (ref 1.0–3.6)
MCH: 28.1 pg (ref 26.0–34.0)
MCHC: 31.3 g/dL — AB (ref 32.0–36.0)
MCV: 90 fL (ref 80–100)
Monocyte #: 0.3 x10 3/mm (ref 0.2–0.9)
Monocyte %: 5.6 %
NEUTROS PCT: 90.5 %
Neutrophil #: 4.8 10*3/uL (ref 1.4–6.5)
PLATELETS: 151 10*3/uL (ref 150–440)
RBC: 3.73 10*6/uL — ABNORMAL LOW (ref 3.80–5.20)
RDW: 16.5 % — AB (ref 11.5–14.5)
WBC: 5.3 10*3/uL (ref 3.6–11.0)

## 2014-10-20 LAB — BASIC METABOLIC PANEL
ANION GAP: 4 — AB (ref 7–16)
BUN: 35 mg/dL — AB (ref 7–18)
CALCIUM: 10.5 mg/dL — AB (ref 8.5–10.1)
CREATININE: 2.18 mg/dL — AB (ref 0.60–1.30)
Chloride: 98 mmol/L (ref 98–107)
Co2: 36 mmol/L — ABNORMAL HIGH (ref 21–32)
EGFR (African American): 28 — ABNORMAL LOW
GFR CALC NON AF AMER: 23 — AB
Glucose: 108 mg/dL — ABNORMAL HIGH (ref 65–99)
OSMOLALITY: 284 (ref 275–301)
Potassium: 4.1 mmol/L (ref 3.5–5.1)
Sodium: 138 mmol/L (ref 136–145)

## 2014-10-20 LAB — PROTIME-INR
INR: 2.5
Prothrombin Time: 26.2 secs — ABNORMAL HIGH (ref 11.5–14.7)

## 2014-10-20 LAB — URINE CULTURE

## 2014-11-07 ENCOUNTER — Ambulatory Visit: Payer: Self-pay | Admitting: Internal Medicine

## 2015-01-06 DEATH — deceased

## 2015-02-28 NOTE — H&P (Signed)
PATIENT NAME:  Morgan Levine, HALBLEIB MR#:  [id number] DATE OF BIRTH:  Nov 18, 1932  DATE OF ADMISSION:  02/28/2014  PRIMARY CARE PROVIDER: Dr. Margarita Rana.   EMERGENCY DEPARTMENT REFERRING PHYSICIAN: Dr. Kerman Passey.    CHIEF COMPLAINT: Acute respiratory failure, decrease in responsiveness.   HISTORY OF PRESENT ILLNESS: The patient is a 81 year old white female with multiple medical problems including coronary artery disease, chronic respiratory failure on 3 liters of O2, has a history of paroxysmal Afib, COPD, O2 dependent, who is not mobile last days at home with her daughter who is taking care of her, as well as home hospice following her. According to them, the patient has had difficulty with breathing since Sunday and has been drowsy.  They thought that it could have been due to the pain medication that she was receiving, and she has also had decrease in responsiveness. Her breathing was worse since 11:00 today; therefore, she was brought in the ED. Her sats were in the 60s on 3 liters. Due to that, she had to be placed on BiPAP. The patient had an ABG check, which showed hypercarbic respiratory failure. Placed on BiPAP and she is currently still unarousable. Daughter states that she has not noticed her to have any fevers or chills. The patient has been wheezing a lot. Has chronic abdominal pain radiating to her back.   PAST MEDICAL HISTORY: Significant for:  1. Coronary artery disease.  2.  History of inferior wall myocardial infarction with cardiac cath in 2010 that showed a 70% proximal stenosis. 3.  Paroxysmal Afib.  4.  COPD, O2 dependent.  5.  Hypertension and hyperlipidemia.  6.  Chronic renal insufficiency.  7.  History of TIA.  8.  Severe mitral regurg and tricuspid regurg.  9.  Moderate pulmonary hypertension.  10.  History of right hip fracture.  11.  History of thrombocytopenia. Has been followed by hematology in the past. Platelet count currently normal.  12.  History of diastolic  CHF.  13.  Osteoarthritis.  14.  Anxiety and depression.  15.  Macular degeneration.  16.  Peripheral vascular disease.  17.  History of vitamin B12 deficiency.   PAST SURGICAL HISTORY:  1.  Status post abdominal aortic aneurysm repair.  2.  Status post hysterectomy.  3.  Back surgery x 3.  4.  Surgery for macular degeneration.  5.  Right open hip reduction and internal fixation.   ALLERGIES: Percocet and sulfa.   CURRENT MEDICATIONS AT HOME: She is on Coumadin 2 mg daily, bupropion 10 mg daily, Klor-Con, was on 20 mEq, but is held since 04/23; Toprol-XL 100 daily, vitamin B12 1000 mcg daily, Drisdol 50,000 units 1 capsule every 2 weeks, Claritin 10 daily, Prevacid 15 mg daily, folic acid 1 mg daily, hydrocodone/acetaminophen 5/325 mg 1 tab p.o. q.4-6 p.r.n., promethazine suppositories 25 mg q.4 rectally p.r.n., Xopenex nebs 2 puffs every 4 to 6 hours as needed, Breo Ellipta 2 puffs b.i.d., Advair 250/50, 1 puff b.i.d.; Estrace 0.1 mg cream vaginally at bedtime for 2 weeks and then 2 times a week, alendronate 70 mg weekly, senna 2 tabs b.i.d., Sensipar 1 tab p.o. daily, Lasix 40 daily, lisinopril 20 mg 1 tab p.o. daily, mirtazapine 45 at bedtime, lorazepam 1 mg 1-1/2 tabs at bedtime, Celexa 40 daily, Spiriva HandiHaler inhaler, Tiazac 240 daily.   SOCIAL HISTORY: Currently does not smoke. Does not drink. Lives with her daughter, stays with her to help take care of her. She has home hospice following her.  FAMILY HISTORY: Brother died of rheumatic fever and heart valve problem. Mother died at  102. Her father died at 33 years of age and had cancer.   REVIEW OF SYSTEMS:  Unobtainable due to the patient being poorly responsive.   PHYSICAL EXAMINATION: VITAL SIGNS: Temperature 97.9, pulse 85, respirations 24, blood pressure 122/90, O2 of 82%. The patient is a critically ill-appearing female unresponsive to stimuli, currently on BiPAP.  HEENT:  AND NECK: Head atraumatic, normocephalic. Pupils  equally round, reactive to light and accommodation. Nasal exam shows no drainage or ulceration.  Oropharynx: She has BiPAP in place.   Neck is supple without any JVD. External ear exam shows no erythema or drainage and nasal exam shows no ulceration or drainage.  CARDIOVASCULAR: Irregularly irregular rhythm. There is a murmur at the right sternal border.  LUNGS: Has bilateral wheezing and rhonchi throughout both lungs. There is accessory muscle usage. She is currently on BiPAP.  ABDOMEN: Soft, nontender, nondistended. Positive bowel sounds x 4.  EXTREMITIES: No clubbing, cyanosis or edema.  Diminished DP, PT pulses.  SKIN: No rash.  LYMPHATICS: No lymph nodes palpable.  VASCULAR: Good DP, PT pulses.  NEUROLOGIC: The patient poorly responsive.  PSYCHIATRIC: The patient is poorly responsive.   LABORATORY DATA:  Glucose 88, BUN 30, creatinine 2.01, sodium 134, potassium 5.6, chloride 94 CO2 is 98. CO2 is 33. Calcium 10.8. LFTs: Total protein 7.5, albumin 2.9, bili total 0.6, alk phos 81, AST 10, ALT 10. CPK 13, CK-MB 1.2, troponin less than 0.02. WBC 9.5, hemoglobin 12.1, platelet count 231. INR 3.9. Chest x-ray shows no change from previous. Exam showed bilateral pleural effusions, some nodular-appearing densities noted in the left mid lung.   ASSESSMENT AND PLAN: The patient is a 81 year old white female with chronic respiratory failure, on 3 liters of O2 followed by hospice, has chronic obstructive pulmonary disease, history of diastolic congestive heart failure, atrial fib, followed by home hospice, brought in for respiratory difficulties and decrease in responsiveness.  1. Acute on chronic respiratory failure with hypercarbic respiratory failure, due to a combination of COPD exacerbation and possible CHF. At this time, we will treat the patient with nebulizers, IV Solu-Medrol, IV Levaquin. The patient also may have possible diastolic CHF, so we will treat her with IV Lasix, monitor her renal  function.   2.  Chronic atrial fib. INR is elevated. We will hold Coumadin. Heart rate is stable.  3.  Hypertension. Blood pressure borderline. We will hold blood pressure medications.  4.  Severe mitral regurgitation. Severe tricuspid regurgitation.   5. Hyperlipidemia. Hold p.o. medications.  6.  Acute on chronic renal failure. We will monitor her renal function.  7.  Hyperkalemia. Monitor potassium. Will likely not be able to take any Kayexalate at this point.   CODE STATUS: DNR.  Discussed with daughter that if no improvement may need to consider comfort care. At this time, we will treat her. However, prognosis is very guarded.   Please note time spent on this patient's critical care H and P is 60 minutes.     ____________________________ Lafonda Mosses Posey Pronto, MD shp:dmm D: 02/28/2014 17:02:40 ET T: 02/28/2014 19:44:56 ET JOB#: [id number]  cc: Lizzeth Meder H. Posey Pronto, MD, <Dictator> Alric Seton MD ELECTRONICALLY SIGNED 03/04/2014 8:13

## 2015-02-28 NOTE — Discharge Summary (Signed)
PATIENT NAME:  Morgan Levine, Morgan Levine MR#:  [id number] DATE OF BIRTH:  [birth year]  DATE OF ADMISSION:  02/28/2014 DATE OF DISCHARGE:  03/03/2014  ADMITTING DIAGNOSIS: Decrease in responsiveness.   DISCHARGE DIAGNOSES: 1.  Acute encephalopathy due to acute on chronic respiratory failure with hypercarbia, now improved.  2.  Acute on chronic respiratory failure with exacerbation due to acute chronic obstructive pulmonary disease exacerbation and now improved.  3.  Chronic atrial fibrillation with elevated INR noted during hospitalization. Coumadin was held. Can be restarted on the 28th.  4.  Hypertension. Blood pressure borderline normal. Blood pressure medications were held. 5.  Severe mitral regurgitation, severe tricuspid regurgitation. 6.  Hyperlipidemia.  7.  Acute renal failure, on chronic renal failure. The patient's renal function now is close to baseline.  8.  Hyperkalemia.  9.  History of inferior wall myocardial infarction with 70% proximal stenosis with coronary artery disease.  10.  History of transient ischemic attack.  11.  Moderate pulmonary hypertension.  12.  History of right hip fracture.  13.  History of thrombocytopenia. Platelet count was normal during hospitalization.  14.  History of diastolic congestive heart failure.  15.  Osteoarthritis.  16.  Anxiety and depression.  17.  Macular degeneration.  18.  Peripheral vascular disease.  19.  History of vitamin B12 deficiency.  20.  Status post abdominal aortic aneurysm repair.  21.  Status post hysterectomy.  22.  Status post back surgery x3.  23.  Surgery for macular degeneration.  24.  Status post right open hip reduction internal fixation.   PERTINENT LABS AND EVALUATIONS: Admitting glucose 88, BUN 30, creatinine 2.01, sodium 134, potassium 5.6, chloride 94, CO2 33, calcium 10.8. LFTs: Total protein 7.5, albumin 2.9, bili total 0.6, alk phos 81, AST 10, ALT 10. CPK 13, CK-MB 1.2. Troponin was less than 0.02. WBC 9.5,  hemoglobin 12.1, platelet count 231,000. INR 3.9.   Chest x-ray showed no change compared to previous. It did show bilateral pleural effusions, some nodular-appearing density noted in the left mid lung.   HOSPITAL COURSE: Please refer to H and P done by me on admission. The patient is a 81 year old white female with chronic respiratory failure who is on 3 liters of oxygen, has multiple medical problems including coronary artery disease, paroxysmal A-fib and COPD who is followed by hospice who was not feeling well for the past few days, and the patient had been drowsy. On the day of admission, the patient was poorly responsive and brought to the ED. She underwent evaluation and was found to have hypercarbic respiratory failure. The patient had to be placed on BiPAP. She was DNR; therefore, she was not intubated. The patient's condition was guarded on presentation. She was continued on BiPAP. She was treated for COPD exacerbation as well as possible diastolic CHF exacerbation. In terms of her COPD, her respiratory status improved and we were able to get her off the BiPAP. She also was diuresed, however, her renal function did worsen. Therefore, we stopped her Lasix and she received some IV fluids. She had hyperkalemia which was treated with 1 dose of Kayexalate and her hyperkalemia resolved. She also had an INR that was elevated; therefore, her Coumadin was held. She is doing much better and is currently back to baseline, very interested in going home. At this time, the patient is being discharged with resumption of home hospice.   DISCHARGE MEDICATIONS: Mirtazapine 45 mg at bedtime, lorazepam 1 mg 1-1/2 tabs at bedtime, Advair 250/50  one puff b.i.d., Spiriva 18 mcg daily, Tiazac 240 daily, buspirone 10 mg 1/2 tab b.i.d., Fosamax 70 mg every Tuesday, Toprol-XL 50 daily, Claritin 10 daily, Prevacid 30 daily, vitamin B12 4353 mcg daily, folic acid 1 tab p.o. daily, Zofran 4 mg t.i.d. as needed, prednisone taper  starting at 60 and taper by 10 until complete, Ceftin 500 one tab p.o. b.i.d. x5 days, albuterol ipratropium inhalation 4 times a day as needed.   NOTE: The patient is recommended to stop Lasix, lisinopril, and potassium, and hold Coumadin until the 28th.   DISCHARGE DIET: Low-sodium, low-fat, low-cholesterol.  HOME OXYGEN: 2 to 3 liters nasal cannula, resumption of home hospice services.   DISCHARGE ACTIVITY: As tolerated.   DISCHARGE FOLLOWUP: Follow up with primary MD in 1 to 2 weeks.  TIME SPENT: 35 minutes.   ____________________________ Morgan Mosses Posey Pronto, MD shp:sb D: 03/04/2014 08:18:27 ET T: 03/04/2014 09:33:16 ET JOB#: [id number]  cc: Latrish Mogel H. Posey Pronto, MD, <Dictator> Alric Seton MD ELECTRONICALLY SIGNED 03/07/2014 14:03

## 2015-03-04 NOTE — Discharge Summary (Signed)
PATIENT NAME:  Morgan Levine, [facility] Levine MR#:  [id number] DATE OF BIRTH:  [birth year]  DATE OF ADMISSION:  10/18/2014 DATE OF DISCHARGE:    ADMITTING DIAGNOSIS: Acute respiratory failure.   DISCHARGE DIAGNOSES:  1.  Acute on chronic respiratory failure with hypoxia and hypercapnia.  2.  Chronic obstructive pulmonary disease exacerbation.  3.  Acute bronchitis.  4.  Acute on chronic diastolic congestive heart failure.  5.  Urinary tract infection, Escherichia coli.  6.  Acute on chronic renal failure with known history of chronic kidney disease.  7.  History of hypertension, essential.  8.  Coronary artery disease.  9.  Hyperlipidemia.  10.  Paroxysmal atrial fibrillation on chronic anticoagulation with Coumadin therapy and the most recent pro time done on 10/20/2014 is 2.5.  11.  History of transient ischemic attack.  12.  Peripheral vascular disease.  13.  History of chronic respiratory failure on 2.5 liters of oxygen through nasal cannula at home.  14.  Hypercalcemia likely due to primary hyperparathyroidism.    DISCHARGE CONDITION: Stable.   DISCHARGE MEDICATIONS: The patient is to continue Remeron 45 mg p.o. daily, lorazepam 1.5 mg p.o. at bedtime, Tiazac 240 mg p.o. daily, buspirone 5 mg p.o. twice daily, Toprol-XL 50 mg p.o. daily, Claritin 10 mg p.o. daily, Prevacid 30 mg p.o. daily, vitamin B12 1000 mcg p.o. daily, Coumadin 2.5 mg p.o. daily, citalopram 40 mg p.o. daily, senna Levine 50/8.6 two tablets twice daily, Lasix 40 mg p.o. twice daily, prednisone taper 50 mg p.o. once on 10/21/2014, then taper x 10 mg every 2 days until stopped, isosorbide mononitrate 30 mg p.o. daily, new medication, tiotropium 1 inhalation once daily, new medication, levalbuterol 1.25 mg 6 times daily as needed, Symbicort 160/4.5 two puffs twice daily, Keflex 250 mg 3 times daily for 6 more days, guaifenesin 600 mg p.o. twice daily, erythromycin 500 mg p.o. once daily for 3 more days, hydralazine 10 mg 3 times daily.    HOME OXYGEN: With portable tank 2-2.5 liters of oxygen through nasal cannula keeping her O2 saturations at 88%-92%. The patient was advised not to use high rate of oxygen as it may result in worsening CO2 retention, shortness of breath, as well as weakness and confusion.    DIET:  2 gram salt, low-fat, low-cholesterol, mechanical soft with thin liquids, general aspiration precautions, extra gravy to add to the meats to moisten well.  Medications to be given in puree for easier safer swallowing. Try to sit up at meals.    ACTIVITY LIMITATIONS: As tolerated.   REFERRAL: To hospice at home. Followup appointment with Dr. Elease HashimotoMaloney in 2 days after discharge.   CONSULTANTS: Care management, social work, palliative care Mr. Laurette SchimkeJoshua Borders, also with speech therapy.   RADIOLOGIC STUDIES: Chest portable single view 10/18/2014 revealing cardiac enlargement with mild pulmonary vascular congestion, improving edema, and effusions since prior study. Repeated chest x-ray 1 view 10/20/2014 revealed mild bibasilar atelectasis and small right pleural effusion, negative for heart failure or edema. V/Q scan 10/20/2014 revealed underlying emphysema, there is a single fairly large matching ventilation perfusion defect in the right upper lobe, there are several matching subsegmental defects throughout both lungs elsewhere, there are no appreciable ventilation perfusion mismatches, overall findings constitute relatively low probability of pulmonary embolus according to radiologist.   HOSPITAL COURSE: The patient is a 81 year old Caucasian female with history of COPD, chronic respiratory failure, who presented to the hospital with complaints of shortness of breath as well as cough. Please refer to Dr.  Sena Clouatre'Levine admission note on 10/18/2014. On arrival to the hospital the patient'Levine O2 saturations were 77% on 2.5 liters of oxygen through nasal cannula.  The patient'Levine x-ray was concerning for possible mild fluid retention. Her  pulse was 106, and she was in atrial fibrillation according to EKG. The patient'Levine laboratory data done on arrival to the hospital showed BUN and creatinine of 22 and 2.18. Beta-type natriuretic peptide was 13,972. Glucose was 107, otherwise BMP was unremarkable. Calcium level was elevated at 11.2.  The patient'Levine magnesium level was found to be normal at 1.9. Lipase level was 48.  Liver enzymes, albumin level of 3.0, otherwise unremarkable. Cardiac enzymes x 3 were within normal limits. White blood cell count was 9.7, hemoglobin was 11.8 and platelet count was 173,000, absolute neutrophil count was elevated at 7.3. Pro time was 23.0 and INR was 2.1. Urinalysis revealed 2568 white blood cells, 395 red blood cells, 3 + leukocyte esterase, 3 + bacteria, 3 epithelial cells, white blood cell clumps. Urine cultures showed 100,000 colony-forming units of Escherichia coli sensitive to all antibiotics, but ciprofloxacin as well as levofloxacin. The patient was initiated on Rocephin and Zithromax on arrival to the hospital with improvement of her condition. Initially on arrival to the hospital the patient'Levine ABGs showed pH of 7.17, pCO2 was 97, pO2 was 93 on nasal cannula with lactic acid level of 0.5.  The patient was admitted to the hospital. She required BiPAP for oxygenation. She was diuresed. She was started on antibiotics as well as steroids, as well as inhalation therapy, and with this her condition significantly improved.  With therapy her respirations improved and her oxygenation. She was weaned down from BiPAP to nasal cannula and later on to her regular oxygen rate. She is being discharged on 2-2.5 liters of oxygen nasal cannula and her O2 saturations are stable at 95%-97% on room air. She was advised not to jack up her O2 high and keep her O2 saturations at around 92% so she is comfortable. She is to follow up with hospice as well as her primary care physician for further recommendations. She is DNR per her request.    In regards to acute bronchitis unfortunately we were not able to get any sputum for cultures. The patient is to continue antibiotic therapy with Zithromax for the next 3 days to complete course.   For COPD exacerbation she is to continue inhalation therapy as well as steroid taper.   For acute on chronic diastolic CHF the patient was diuresed and her kidney function worsened, however with holding Lasix her kidney function normalized to creatinine level of 2.18, the same as she was on admission.  Unfortunately this is higher than her usual level of 1.6, her baseline. The patient was advised to follow up with her primary physician and possibly hold her diuretics if needed in the future.  We felt however the patient'Levine urinary tract infection could have been contributing to her kidney decline.   In regards to urinary tract infection, the urine cultures grew Escherichia coli sensitive to all antibiotics, with Levaquin as well as ciprofloxacin. The patient was to continued on the Rocephin while she was in the hospital, however she is to continue Keflex for the next 6 days to complete course.   In regards to acute on chronic renal failure, as mentioned above the patient'Levine Lasix stopped for a short period of time. Her creatinine returned back to her arrival creatinine of 2.18 which was still higher than her usual baseline of  1.6 also. The patient was recommended to continue diuretics, however watching her kidney function carefully and closely by her primary care physician.   In regards to acute on chronic diastolic CHF, as mentioned above we were not able to advance her diuretic doses, however felt that the patient may benefit from low-dose of also afterload-reducing agents, so she was initiated on hydralazine as well as low dose of Imdur. It is recommended to the patient'Levine check heart rate periodically and discontinue hydralazine if her heart rate goes up significantly.   In regards to her chronic medical  problems she is to continue her outpatient management. The patient is being discharged in stable condition with the above-mentioned medications and followup.   On the day of discharge, temperature was 98.5, pulse was 95, respiration was 18-20, blood pressure 102/65, saturation was 95%-97% on 2 liters of oxygen through nasal cannula at rest.   TIME SPENT: 40 minutes.    ____________________________ Katharina Caper, MD rv:bu D: 10/20/2014 16:33:09 ET T: 10/20/2014 17:26:37 ET JOB#: [id number]  cc: Katharina Caper, MD, <Dictator> Leo Grosser, MD Saed Hudlow MD ELECTRONICALLY SIGNED 11/10/2014 21:09

## 2015-03-04 NOTE — H&P (Signed)
Levine NAME:  Morgan Levine, Morgan Levine MR#:  [id number] DATE OF BIRTH:  09-21-1933  DATE OF ADMISSION:  10/18/2014  PRIMARY CARE PHYSICIAN: Lorie Phenix, MD   HISTORY OF PRESENT ILLNESS:  Morgan Levine is a 81 year old Caucasian female with past medical history significant for history of coronary artery disease, history of chronic respiratory failure on 3 liters of oxygen per nasal cannula who presented to [facility] with complaints of shortness of breath as well as cough. Apparently, according to Morgan Levine's daughter, who was present during my interview, she has noted that Morgan Levine has been coughing and was very short of breath.  She was having difficulty breathing.  She apparently was found by EMS with O2 saturation of 77% on 2.5 liters of oxygen at home. Morgan Levine was confused and was oriented only to self and possibly to place at times according to medical records.  She sounded congested according to EMS.  On arrival to [facility], she was noted to be still obtunded. Her heart rate was about 105 to 110s and her chest x-ray revealed some pulmonary congestion.  Hospitalist services were contacted for admission.   PAST MEDICAL HISTORY: Significant for history of multiple medical problems including coronary artery disease, history of inferior wall myocardial infarction with cardiac catheterization in 2010 which showed 70% proximal stenosis, paroxysmal atrial fibrillation, chronic obstructive pulmonary disease, oxygen dependent on 2.5 to 3 liters of oxygen through nasal cannula at home, hypertension, hyperlipidemia, chronic renal insufficiency, history of TIA, severe mitral regurgitation as well as tricuspid regurgitation, moderate pulmonary hypertension preserved ejection fraction, history of right hip fracture, history of thrombocytopenia followed by hematology in [facility] past, platelet count was normal recently, history of diastolic congestive heart failure, osteoarthritis, anxiety, depression, macular  degeneration and peripheral vascular disease, history of B12 deficiency.   PAST SURGICAL HISTORY: Abdominal aortic aneurysm repair, status post hysterectomy, back surgery x 3, surgery for macular degeneration, right open hip reduction and internal fixation.   ALLERGIES: PERCOCET AS WELL AS SULFA.   MEDICATIONS: According to medical records Morgan Levine is on Tiazac extended release 240 mg p.o. daily, Toprol-XL 50 mg p.o. daily, Coumadin 2.5 mg p.o. daily, citalopram 40 mg p.o. daily, Claritin 10 mg p.o. daily, Prevacid 30 mg p.o. daily, B12 1000 mcg p.o. daily, potassium chloride 10 mg twice daily, lorazepam 1 mg 1-1/2 tablets at bedtime, Senna-S 2 tablets in [facility] morning as well as 2 tablets in [facility] evening, Buspirone 5 mg p.o. twice daily, Remeron 45 mg at bedtime and Lasix 40 mg p.o. twice daily.    SOCIAL HISTORY:  According to medical records, Morgan Levine currently does not smoke.  No alcohol, lives with her daughter who stays with her to take care of her.  She also had a home hospice following her.   FAMILY HISTORY: Brother died of rheumatic fever, had heart valve problem. Mother died at 29.  Morgan Levine's father died at 49 and had cancer.  REVIEW OF SYSTEMS:  Unobtainable as Morgan Levine is very poorly responsive, although she denies any chest pain or any kind of other abdominal pains or anything.     PHYSICAL EXAMINATION:   VITAL SIGNS:  On arrival to [facility], [facility] Levine's vital signs: Temperature was 98.7, pulse was 106, respirations 27, blood pressure 155/109, saturation was 79% on room air.  During my evaluation, she is on nonrebreather and her O2 saturations are 100% on nonrebreather.  Her respiration rate is 26, blood pressure 117/84, heart rate was  ranging from 80s-105.   GENERAL:  During my evaluation, she is very uncomfortable, stressed out.   A well-developed, well-nourished Caucasian female sitting slumped on [facility] stretcher.  HEENT: Her pupils are equal, reactive to light.   Extraocular muscles intact, no icterus or conjunctivitis, has normal hearing. No pharyngeal erythema. Mucosa is moist.  NECK: No masses, supple, nontender.  Thyroid not enlarged. No adenopathy. No JVD or carotid bruits bilaterally.  Full range of motion.  LUNGS: Crackles bilaterally, anteriorly. No rales, rhonchi, or wheezing. Markedly diminished breath sounds.  Morgan Levine does have labored inspirations as well as tachypnea and increased effort to breathe in moderate respiratory distress.  CARDIOVASCULAR: S1, S2 appreciated. Rhythm is irregularly irregular.  No murmurs, rubs or gallops.  PMI not lateralized.  Chest is nontender to palpation.  Diminished pedal pulses. Trace to 1+ lower extremity edema, mostly in on [facility] dorsal aspect of her feet.  ABDOMEN: Soft, nontender. Bowel sounds are present. No hepatosplenomegaly or masses were noted.  RECTAL: Deferred.  MUSCLE STRENGTH: Able to move her extremities. Not able to assess her in regards to sitting up or walking.   No cyanosis. Not able to assess for kyphosis. Gait was not tested.   SKIN: Did not reveal any rashes, lesions, erythema, nodularity, or induration. It was warm and dry to palpation.  Morgan Levine does have onychomycosis in all fingers as well as her toes.  NEUROLOGIC: Difficult to obtain as Morgan Levine is poorly responsive.  Cranial nerves grossly intact, not able to assess her sensory level and some mild dysarthria.  Morgan Levine is somnolent, slumped, disoriented and poorly cooperative.   LABORATORY DATA:  BMP showed creatinine of 2.18, a BUN of 22, beta-type natriuretic peptide was 13,972, glucose level of 107, otherwise BMP was unremarkable.  Morgan Levine's calcium level was elevated at 11.2, magnesium level 1.9. Lipase level of 48, albumin level of 3.0, otherwise unremarkable study. Troponin less than 0.02. White blood cell count was normal at 9.7, hemoglobin 11.8, platelet count 173,000.  Absolute neutrophil count was elevated at 7.3.  Coagulation panel, Pro-time 23.0, INR was 2.1. Urinalysis; Yellow, turbid urine, negative for glucose, bilirubin or ketones.  Specific gravity 1.008, pH was 5.0, 3+ blood, 100 mg/dL protein, positive for nitrites, 3+ leukocytes esterase, 395 red blood cells, 2568 white blood cells, 3+ bacteria, 3 epithelial cells, white blood cell clumps as well as mucus was also present.  ABGs are pending.  EKG showed atrial fibrillation at rate of 105, normal axis. Anteroseptal infarct, age indeterminate.  T-depressions in lateral leads.   RADIOLOGIC STUDIES: Chest x-ray, portable single view of 10/18/2014, revealed cardiac enlargement and mild pulmonary vascular congestion, improving edema and effusions since prior study.   ASSESSMENT AND PLAN:  1.  Acute on chronic respiratory failure, likely due to congestive heart failure exacerbation as well as chronic obstructive pulmonary disease exacerbation and bronchitis. Admit Morgan Levine to [facility] medical floor, started on Lasix IV, continue her on steroids which were already initiated in [facility], antibiotics inhalation therapy and oxygen.  Get ABGs stat and Morgan Levine may need BiPAP.  We will try to figure out about her intubation status. 2.  Acute on chronic diastolic congestive heart failure. Will continue Morgan Levine on Lasix IV, high dose of nitroglycerin topically, oxygen as needed keeping her pulse oximetry at around 88% to 92%.  3.  Acute on chronic renal failure. Will follow with Lasix therapy.  4.  Urinary tract infection. Get cultures and start Morgan Levine  on Rocephin.  5.  Atrial fibrillation. Rate poorly controlled and we will continue Morgan Levine on metoprolol. Coumadin, Cardizem, following INR daily.  Coumadin may not be given orally so we will check her Pro-time in [facility] morning.   6.  Altered mental status, likely metabolic encephalopathy.  Get speech therapist evaluation, has BiPAP as needed, following her mental status every 4 hours.   TIME SPENT:  50 minutes.     ____________________________ Katharina Caperima Jasim Harari, MD rv:DT D: 10/18/2014 08:29:34 ET T: 10/18/2014 09:16:07 ET JOB#: [id number]  cc: Katharina Caperima Josslynn Mentzer, MD, <Dictator> Leo GrosserNancy J. Maloney, MD  Rmani Kellogg MD ELECTRONICALLY SIGNED 11/20/2014 8:50
